# Patient Record
Sex: Female | Born: 2000
Health system: Southern US, Community
[De-identification: ages and names within clinical notes are randomized; demographics above are authoritative.]

---

## 2021-01-24 ENCOUNTER — Emergency Department (HOSPITAL_COMMUNITY): Payer: Self-pay

## 2021-01-24 ENCOUNTER — Encounter (HOSPITAL_COMMUNITY): Payer: Self-pay | Admitting: Emergency Medicine

## 2021-01-24 ENCOUNTER — Emergency Department (HOSPITAL_COMMUNITY)
Admission: EM | Admit: 2021-01-24 | Discharge: 2021-01-24 | Disposition: A | Discharge status: Home / Self Care | Payer: Self-pay | Attending: Physician Assistant | Admitting: Physician Assistant

## 2021-01-24 ENCOUNTER — Other Ambulatory Visit: Payer: Self-pay

## 2021-01-24 DIAGNOSIS — M25571 Pain in right ankle and joints of right foot: Secondary | ICD-10-CM | POA: Insufficient documentation

## 2021-01-24 DIAGNOSIS — Z5321 Procedure and treatment not carried out due to patient leaving prior to being seen by health care provider: Secondary | ICD-10-CM | POA: Insufficient documentation

## 2021-01-24 NOTE — ED Notes (Signed)
Pt decided to leave 
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
